# Patient Record
Sex: Male | Born: 1990 | Race: White | Hispanic: No | Marital: Single | Smoking: Never smoker
Health system: Southern US, Community
[De-identification: ages and names within clinical notes are randomized; demographics above are authoritative.]

## PROBLEM LIST (undated history)

## (undated) HISTORY — PX: TONSILLECTOMY: SUR1361

---
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
## 2008-02-05 ENCOUNTER — Emergency Department (HOSPITAL_COMMUNITY): Admission: EM | Admit: 2008-02-05 | Discharge: 2008-02-05 | Payer: Self-pay | Admitting: Family Medicine

## 2011-09-21 LAB — INFLUENZA A AND B ANTIGEN (CONVERTED LAB)
Inflenza A Ag: NEGATIVE
Influenza B Ag: NEGATIVE

## 2012-01-30 ENCOUNTER — Encounter (HOSPITAL_COMMUNITY): Payer: Self-pay | Admitting: *Deleted

## 2012-01-30 ENCOUNTER — Emergency Department (HOSPITAL_COMMUNITY)
Admission: EM | Admit: 2012-01-30 | Discharge: 2012-01-30 | Disposition: A | Discharge status: Home / Self Care | Payer: Self-pay | Attending: Emergency Medicine | Admitting: Emergency Medicine

## 2012-01-30 DIAGNOSIS — L259 Unspecified contact dermatitis, unspecified cause: Secondary | ICD-10-CM

## 2012-01-30 DIAGNOSIS — R21 Rash and other nonspecific skin eruption: Secondary | ICD-10-CM | POA: Insufficient documentation

## 2012-01-30 DIAGNOSIS — L0100 Impetigo, unspecified: Secondary | ICD-10-CM

## 2012-01-30 MED ORDER — MUPIROCIN CALCIUM 2 % EX CREA: TOPICAL_CREAM | Freq: Three times a day (TID) | CUTANEOUS | Status: AC

## 2012-01-30 MED ORDER — PREDNISONE 50 MG PO TABS: ORAL_TABLET | ORAL | Status: AC

## 2012-01-30 MED ORDER — CEPHALEXIN 500 MG PO CAPS: 500.0000 mg | ORAL_CAPSULE | Freq: Four times a day (QID) | ORAL | Status: AC

## 2012-01-30 NOTE — ED Notes (Signed)
Pt reports used new face wash the other day. Also went bowling recently and believes it could be from the oil.

## 2012-01-30 NOTE — ED Provider Notes (Signed)
History     CSN: [id number]  Arrival date & time 01/30/12  1150   First MD Initiated Contact with Patient 01/30/12 1212      Chief Complaint  Patient presents with  . Allergic Reaction    (Consider location/radiation/quality/duration/timing/severity/associated sxs/prior treatment) HPI Comments: Red itchy rash to right side of face for the past 3 days. Patient noticed that after he was exposed to bowling ball oil. He denies any fever, chest pain, shortness of breath or wheezing. She's been using Benadryl and hydrocortisone cream without relief. The redness is spreading over his right cheek. There is some crusting but no bleeding or drainage.  The history is provided by the patient.    History reviewed. No pertinent past medical history.  History reviewed. No pertinent past surgical history.  History reviewed. No pertinent family history.  History  Substance Use Topics  . Smoking status: Never Smoker   . Smokeless tobacco: Not on file  . Alcohol Use: Yes     occ      Review of Systems  Constitutional: Negative for fever.  Respiratory: Negative for cough, shortness of breath and wheezing.   Gastrointestinal: Negative for nausea and vomiting.  Genitourinary: Negative for flank pain.  Musculoskeletal: Negative for back pain.  Skin: Positive for rash.  Neurological: Negative for headaches.    Allergies  Penicillins  Home Medications   Current Outpatient Rx  Name Route Sig Dispense Refill  . DIPHENHYDRAMINE HCL 25 MG PO TABS Oral Take 25 mg by mouth every 6 (six) hours as needed. For allergies    . OMEGA-3 FATTY ACIDS 1000 MG PO CAPS Oral Take 1 g by mouth daily.    Marland Kitchen VITAMIN B-6 100 MG PO TABS Oral Take 100 mg by mouth daily.    Marland Kitchen VITAMIN C 500 MG PO TABS Oral Take 500 mg by mouth daily.      BP 129/80  Pulse 77  Temp(Src) 98 F (36.7 C) (Oral)  Resp 18  SpO2 98%  Physical Exam  Constitutional: He is oriented to person, place, and time. He appears  well-developed and well-nourished. No distress.  HENT:  Head: Normocephalic and atraumatic.  Mouth/Throat: Oropharynx is clear and moist. No oropharyngeal exudate.       Erythema to right cheek with crusting towards medial edge. Cranial nerves II through XII intact, no intraoral involvement  Eyes: Conjunctivae are normal. Pupils are equal, round, and reactive to light.  Neck: Normal range of motion.  Cardiovascular: Normal rate and regular rhythm.   No murmur heard. Pulmonary/Chest: Effort normal. No respiratory distress. He has no wheezes.  Abdominal: Soft. There is no tenderness. There is no rebound and no guarding.  Musculoskeletal: Normal range of motion. He exhibits no edema and no tenderness.  Neurological: He is alert and oriented to person, place, and time. No cranial nerve deficit.  Skin: Skin is warm.    ED Course  Procedures (including critical care time)  Labs Reviewed - No data to display No results found.   No diagnosis found.    MDM  Probable contact dermatitis with superinfection. No respiratory or mucous membrane involvement.  We'll treat with steroids and antihistamines and antibiotics follow up in urgent care 2 days        Glynn Octave, MD 01/30/12 1241

## 2012-01-30 NOTE — ED Notes (Signed)
Reports having red rash to left arm and right side of face x 3 days, thinks its an allergic reaction. No relief with benadryl. Airway intact, no resp distress noted at triage.

## 2012-11-13 ENCOUNTER — Other Ambulatory Visit: Payer: Self-pay | Admitting: Emergency Medicine

## 2015-04-08 ENCOUNTER — Encounter (HOSPITAL_COMMUNITY): Payer: Self-pay | Admitting: Emergency Medicine

## 2015-04-08 ENCOUNTER — Emergency Department (INDEPENDENT_AMBULATORY_CARE_PROVIDER_SITE_OTHER)
Admission: EM | Admit: 2015-04-08 | Discharge: 2015-04-08 | Disposition: A | Discharge status: Home / Self Care | Payer: Self-pay | Source: Home / Self Care | Attending: Family Medicine | Admitting: Family Medicine

## 2015-04-08 DIAGNOSIS — L247 Irritant contact dermatitis due to plants, except food: Secondary | ICD-10-CM

## 2015-04-08 MED ORDER — PREDNISONE 10 MG PO TABS: ORAL_TABLET | ORAL | Status: DC

## 2015-04-08 MED ORDER — TRIAMCINOLONE ACETONIDE 0.1 % EX CREA: 1.0000 "application " | TOPICAL_CREAM | Freq: Three times a day (TID) | CUTANEOUS | Status: DC

## 2015-04-08 NOTE — ED Provider Notes (Signed)
CSN: [id number]     Arrival date & time 04/08/15  1414 History   First MD Initiated Contact with Patient 04/08/15 1523     Chief Complaint  Patient presents with  . Poison Ivy   (Consider location/radiation/quality/duration/timing/severity/associated sxs/prior Treatment) HPI      25 year old male presents complaining of a poison ivy rash. He was working in the yard a few days ago and got a spot of poison ivy on his right leg. This is spread to both his arms and hands. Is extremely itchy. He has been taking Benadryl and applying calamine lotion with minimal relief. He denies any systemic symptoms.   History reviewed. No pertinent past medical history. History reviewed. No pertinent past surgical history. No family history on file. History  Substance Use Topics  . Smoking status: Never Smoker   . Smokeless tobacco: Not on file  . Alcohol Use: Yes     Comment: occ    Review of Systems  Skin: Positive for rash.  All other systems reviewed and are negative.   Allergies  Penicillins  Home Medications   Prior to Admission medications   Medication Sig Start Date End Date Taking? Authorizing Provider  diphenhydrAMINE (BENADRYL) 25 MG tablet Take 25 mg by mouth every 6 (six) hours as needed. For allergies    Historical Provider, MD  fish oil-omega-3 fatty acids 1000 MG capsule Take 1 g by mouth daily.    Historical Provider, MD  predniSONE (DELTASONE) 10 MG tablet 4 tabs PO QD for 4 days; 3 tabs PO QD for 3 days; 2 tabs PO QD for 2 days; 1 tab PO QD for 1 day 04/08/15   Graylon Good, PA-C  pyridOXINE (VITAMIN B-6) 100 MG tablet Take 100 mg by mouth daily.    Historical Provider, MD  triamcinolone cream (KENALOG) 0.1 % Apply 1 application topically 3 (three) times daily. 04/08/15   Graylon Good, PA-C  vitamin C (ASCORBIC ACID) 500 MG tablet Take 500 mg by mouth daily.    Historical Provider, MD   BP 127/77 mmHg  Pulse 74  Temp(Src) 97.8 F (36.6 C) (Oral)  Resp 18  SpO2  99% Physical Exam  Constitutional: He is oriented to person, place, and time. He appears well-developed and well-nourished. No distress.  HENT:  Head: Normocephalic.  Pulmonary/Chest: Effort normal. No respiratory distress.  Neurological: He is alert and oriented to person, place, and time. Coordination normal.  Skin: Skin is warm and dry. Rash (erythematous macular rash in a linear distribution lower extremity as well as both hands and arms) noted. He is not diaphoretic.  Psychiatric: He has a normal mood and affect. Judgment normal.  Nursing note and vitals reviewed.   ED Course  Procedures (including critical care time) Labs Review Labs Reviewed - No data to display  Imaging Review No results found.   MDM   1. Irritant contact dermatitis due to plant    Consistent with poison ivy. Treat with oral prednisone, oral Benadryl, and triamcinolone cream. Follow-up when necessary   Meds ordered this encounter  Medications  . predniSONE (DELTASONE) 10 MG tablet    Sig: 4 tabs PO QD for 4 days; 3 tabs PO QD for 3 days; 2 tabs PO QD for 2 days; 1 tab PO QD for 1 day    Dispense:  30 tablet    Refill:  0  . triamcinolone cream (KENALOG) 0.1 %    Sig: Apply 1 application topically 3 (three) times daily.  Dispense:  45 g    Refill:  0       Graylon GoodZachary H Mariachristina Holle, PA-C 04/08/15 1603

## 2015-04-08 NOTE — Discharge Instructions (Signed)

## 2015-04-08 NOTE — ED Notes (Signed)
Reports poss poison ivy onset 3-4 days on both arms Reports he has been doing some yard work Denies fevers, chills Alert, no signs of acute distress.

## 2016-04-26 ENCOUNTER — Encounter (HOSPITAL_COMMUNITY): Payer: Self-pay | Admitting: Emergency Medicine

## 2016-04-26 ENCOUNTER — Ambulatory Visit (HOSPITAL_COMMUNITY)
Admission: EM | Admit: 2016-04-26 | Discharge: 2016-04-26 | Disposition: A | Discharge status: Home / Self Care | Payer: Self-pay | Attending: Emergency Medicine | Admitting: Emergency Medicine

## 2016-04-26 DIAGNOSIS — Z880 Allergy status to penicillin: Secondary | ICD-10-CM | POA: Insufficient documentation

## 2016-04-26 DIAGNOSIS — Z0000 Encounter for general adult medical examination without abnormal findings: Secondary | ICD-10-CM

## 2016-04-26 DIAGNOSIS — Z113 Encounter for screening for infections with a predominantly sexual mode of transmission: Secondary | ICD-10-CM | POA: Insufficient documentation

## 2016-04-26 NOTE — Discharge Instructions (Signed)
Give us a working phone number so that we can contact you if needed. Refrain from sexual contact until you know your results and your partner(s) are treated. Return if you get worse, have a fever >100.4, or for any concerns.   Go to www.goodrx.com to look up your medications. This will give you a list of where you can find your prescriptions at the most affordable prices.

## 2016-04-26 NOTE — ED Notes (Signed)
Partner informed patient to get tested for std, did not give a diagnosis..  Patient denies any symptoms

## 2016-04-26 NOTE — ED Provider Notes (Addendum)
HPI  SUBJECTIVE:  Edward Daniels is a 26 y.o. male who presents for STD screening. States that he was told by his male partner last month that she had an STD, but wouldn't tell him which one it was. He has continued to have intercourse with her and also with another male. He uses condoms inconsistently. No aggravating, alleviating factors. Has not tried anything for this. No fevers, N/V, abd pain, back pain. No general rash, penile discharge, genital itching, testicular pain or swelling, unintentional weight loss, night sweats, urinary urgency, frequency, cloudy or odorous urine, hematuria, dysuria. Past medical history negative for gonorrhea, chlamydia, HSV, HIV, syphilis, Trichomonas, yeast infections, diabetes.    History reviewed. No pertinent past medical history.  History reviewed. No pertinent past surgical history.  No family history on file.  Social History  Substance Use Topics  . Smoking status: Never Smoker   . Smokeless tobacco: None  . Alcohol Use: Yes     Comment: occ    No current facility-administered medications for this encounter.  Current outpatient prescriptions:  .  diphenhydrAMINE (BENADRYL) 25 MG tablet, Take 25 mg by mouth every 6 (six) hours as needed. For allergies, Disp: , Rfl:  .  fish oil-omega-3 fatty acids 1000 MG capsule, Take 1 g by mouth daily., Disp: , Rfl:  .  pyridOXINE (VITAMIN B-6) 100 MG tablet, Take 100 mg by mouth daily., Disp: , Rfl:  .  vitamin C (ASCORBIC ACID) 500 MG tablet, Take 500 mg by mouth daily., Disp: , Rfl:   Allergies  Allergen Reactions  . Penicillins     Family history     ROS  As noted in HPI.   Physical Exam  BP 126/79 mmHg  Pulse 76  Temp(Src) 99.6 F (37.6 C)  Resp 16  SpO2 97%  Constitutional: Well developed, well nourished, no acute distress Eyes:  EOMI, conjunctiva normal bilaterally HENT: Normocephalic, atraumatic,mucus membranes moist Respiratory: Normal inspiratory effort Cardiovascular:  Normal rate GI: nondistended soft, nontender. No suprapubic tenderness  back: No CVA tenderness GU: normal circumcised male, testes descended bilaterally. No penile rash or discharge. No testicular tenderness, swelling. No epididymal tenderness. Patient declined chaperone Lymph: No inguinal lymphadenopathy skin: No rash, skin intact Musculoskeletal: no deformities Neurologic: Alert & oriented x 3, no focal neuro deficits Psychiatric: Speech and behavior appropriate   ED Course   Medications - No data to display  Orders Placed This Encounter  Procedures  . RPR    Standing Status: Standing     Number of Occurrences: 1     Standing Expiration Date:   . HIV antibody    Standing Status: Standing     Number of Occurrences: 1     Standing Expiration Date:     No results found for this or any previous visit (from the past 24 hour(s)). No results found.  ED Clinical Impression  Screening for STD (sexually transmitted disease)  Normal physical exam  ED Assessment/Plan   Previous labs reviewed. No history of gonorrhea or chlamydia.   Sent off GC/chlamydia, trichomonas, HIV, RPR. He wishes to wait for labs before being treated for an STI. States that he will return with his partner/partners for treatment if any of them come back positive. Will not treat empirically now.  Advised pt to refrain from sexual contact until  he knows lab results, and partner(s) are treated if necessary. Pt provided working phone number. Pt agrees with plan.   *This clinic note was created using Dragon dictation software.  Therefore, there may be occasional mistakes despite careful proofreading.  ?4/28. HIV, RPR negative.     Domenick Gong, MD 04/26/16 1830  Domenick Gong, MD 04/27/16 1228

## 2016-04-27 LAB — HIV ANTIBODY (ROUTINE TESTING W REFLEX): HIV Screen 4th Generation wRfx: NONREACTIVE

## 2016-04-27 LAB — RPR: RPR: NONREACTIVE

## 2016-04-27 LAB — URINE CYTOLOGY ANCILLARY ONLY
Chlamydia: NEGATIVE
Neisseria Gonorrhea: NEGATIVE
TRICH (WINDOWPATH): NEGATIVE

## 2016-05-31 ENCOUNTER — Ambulatory Visit (HOSPITAL_COMMUNITY)
Admission: EM | Admit: 2016-05-31 | Discharge: 2016-05-31 | Disposition: A | Discharge status: Home / Self Care | Payer: Self-pay | Attending: Emergency Medicine | Admitting: Emergency Medicine

## 2016-05-31 ENCOUNTER — Encounter (HOSPITAL_COMMUNITY): Payer: Self-pay | Admitting: *Deleted

## 2016-05-31 DIAGNOSIS — N50811 Right testicular pain: Secondary | ICD-10-CM

## 2016-05-31 LAB — POCT URINALYSIS DIP (DEVICE)
Bilirubin Urine: NEGATIVE
GLUCOSE, UA: NEGATIVE mg/dL
HGB URINE DIPSTICK: NEGATIVE
KETONES UR: NEGATIVE mg/dL
Leukocytes, UA: NEGATIVE
Nitrite: NEGATIVE
PH: 7.5 (ref 5.0–8.0)
PROTEIN: 30 mg/dL — AB
SPECIFIC GRAVITY, URINE: 1.015 (ref 1.005–1.030)
UROBILINOGEN UA: 0.2 mg/dL (ref 0.0–1.0)

## 2016-05-31 NOTE — ED Notes (Addendum)
Pt     Reports     Pain  r  Testicle         X  4-5  Days       denys      Any  Discharge       Seen      In    Apr     For  Std  Check         denys   Any  Injury     He   Ambulated  To  Room  With a  Slow  Steady  Gait     Appearing in no   Severe  Or  Acute  Distress

## 2016-05-31 NOTE — ED Provider Notes (Signed)
CSN: [id number]     Arrival date & time 05/31/16  1306 History   First MD Initiated Contact with Patient 05/31/16 1348     Chief Complaint  Patient presents with  . Testicle Pain   (Consider location/radiation/quality/duration/timing/severity/associated sxs/prior Treatment) HPI  He is a 26 year old man here for evaluation of right testicular pain. This is been going on for several days. It is not getting worse. Pain is intermittent. It seems to be worse in the evening and at nighttime. He denies any rashes or lesions. No dysuria or penile discharge. He does report some slight abdominal discomfort, but attributes this to anxiety about the issue. No fevers. He does report a new sexual partner, but they have used condoms. He had STD testing in April that was negative.  History reviewed. No pertinent past medical history. History reviewed. No pertinent past surgical history. History reviewed. No pertinent family history. Social History  Substance Use Topics  . Smoking status: Never Smoker   . Smokeless tobacco: None  . Alcohol Use: Yes     Comment: occ    Review of Systems As in history of present illness Allergies  Penicillins  Home Medications   Prior to Admission medications   Medication Sig Start Date End Date Taking? Authorizing Provider  diphenhydrAMINE (BENADRYL) 25 MG tablet Take 25 mg by mouth every 6 (six) hours as needed. For allergies    Historical Provider, MD  fish oil-omega-3 fatty acids 1000 MG capsule Take 1 g by mouth daily.    Historical Provider, MD  pyridOXINE (VITAMIN B-6) 100 MG tablet Take 100 mg by mouth daily.    Historical Provider, MD  vitamin C (ASCORBIC ACID) 500 MG tablet Take 500 mg by mouth daily.    Historical Provider, MD   Meds Ordered and Administered this Visit  Medications - No data to display  BP 124/78 mmHg  Pulse 78  Temp(Src) 98.6 F (37 C) (Oral)  Resp 18  SpO2 100% No data found.   Physical Exam  Constitutional: He is oriented  to person, place, and time. He appears well-developed and well-nourished.  Cardiovascular: Normal rate.   Pulmonary/Chest: Effort normal.  Abdominal: Hernia confirmed negative in the right inguinal area (he does have a slight bulge with hernia testing, but no true hernia.) and confirmed negative in the left inguinal area.  Genitourinary: Penis normal. Right testis shows no mass, no swelling and no tenderness. Left testis shows no mass, no swelling and no tenderness. Circumcised. No penile erythema or penile tenderness. No discharge found.  No tenderness of the epididymis.  Neurological: He is alert and oriented to person, place, and time.    ED Course  Procedures (including critical care time)  Labs Review Labs Reviewed  POCT URINALYSIS DIP (DEVICE) - Abnormal; Notable for the following:    Protein, ur 30 (*)    All other components within normal limits    Imaging Review No results found.   MDM   1. Right testicular pain    Nothing to suggest infection or STD on history or exam. I'm concerned about abdominal wall weakness without true hernia on the right side. We discussed symptomatic measures. If not improving in 1 week, follow-up with urology.    Charm RingsErin J Ayoub Arey, MD 05/31/16 [phone]

## 2016-05-31 NOTE — Discharge Instructions (Signed)
I do not see any sign of infection or STD. You do appear to have a slight weakness in the abdominal wall on the right. This may be causing your pain. Make sure you use good ergodynamics when lifting at work. Try wearing a pair of tighter underwear with a rolled up pair of socks in the right groin to see if this helps. Do gentle ab work. If it causes pain, stop doing this. If this is not getting better in 1 week, please follow-up with Dr. Mena GoesEskridge in urology.

## 2016-07-23 ENCOUNTER — Encounter (HOSPITAL_COMMUNITY): Payer: Self-pay | Admitting: Emergency Medicine

## 2016-07-23 ENCOUNTER — Ambulatory Visit (HOSPITAL_COMMUNITY)
Admission: EM | Admit: 2016-07-23 | Discharge: 2016-07-23 | Disposition: A | Discharge status: Home / Self Care | Payer: Self-pay | Attending: Emergency Medicine | Admitting: Emergency Medicine

## 2016-07-23 DIAGNOSIS — N489 Disorder of penis, unspecified: Secondary | ICD-10-CM

## 2016-07-23 DIAGNOSIS — N4889 Other specified disorders of penis: Secondary | ICD-10-CM

## 2016-07-23 DIAGNOSIS — R21 Rash and other nonspecific skin eruption: Secondary | ICD-10-CM | POA: Insufficient documentation

## 2016-07-23 MED ORDER — MUPIROCIN 2 % EX OINT: 1.0000 "application " | TOPICAL_OINTMENT | Freq: Two times a day (BID) | CUTANEOUS | 0 refills | Status: AC

## 2016-07-23 NOTE — ED Provider Notes (Signed)
[facility]    CSN: [id number] Arrival date & time: 07/23/16  1041  First Provider Contact:  First MD Initiated Contact with Patient 07/23/16 1121        History   Chief Complaint Chief Complaint  Patient presents with  . Rash    HPI Edward Daniels is a 26 y.o. male.   He is a 26 year old man here for evaluation of penile rash. He states this started 4 days ago. Initially, he states there were 2 lesions, but one has healed up. He has been using Neosporin on it. He states when it first showed up he had one episode of burning, but not with urination. He denies any dysuria or other urinary symptoms. No penile discharge. No new sexual partners. He was seen here in April and had STD testing including HIV and syphilis that was negative.    Rash  Associated symptoms: no abdominal pain and no fever     History reviewed. No pertinent past medical history.  There are no active problems to display for this patient.   Past Surgical History:  Procedure Laterality Date  . TONSILLECTOMY         Home Medications    Prior to Admission medications   Medication Sig Start Date End Date Taking? Authorizing Provider  diphenhydrAMINE (BENADRYL) 25 MG tablet Take 25 mg by mouth every 6 (six) hours as needed. For allergies    Historical Provider, MD  fish oil-omega-3 fatty acids 1000 MG capsule Take 1 g by mouth daily.    Historical Provider, MD  mupirocin ointment (BACTROBAN) 2 % Apply 1 application topically 2 (two) times daily. 07/23/16   Charm Rings, MD  pyridOXINE (VITAMIN B-6) 100 MG tablet Take 100 mg by mouth daily.    Historical Provider, MD  vitamin C (ASCORBIC ACID) 500 MG tablet Take 500 mg by mouth daily.    Historical Provider, MD    Family History Family History  Problem Relation Age of Onset  . Hypertension Mother     Social History Social History  Substance Use Topics  . Smoking status: Never Smoker  . Smokeless tobacco: Never Used  . Alcohol use Yes      Comment: occ     Allergies   Penicillins   Review of Systems Review of Systems  Constitutional: Negative for fever.  Gastrointestinal: Negative for abdominal pain.  Genitourinary: Positive for genital sores. Negative for dysuria, testicular pain and urgency.  Skin: Positive for rash.     Physical Exam Triage Vital Signs ED Triage Vitals [07/23/16 1059]  Enc Vitals Group     BP 128/81     Pulse Rate 60     Resp 16     Temp 98.6 F (37 C)     Temp Source Oral     SpO2 100 %     Weight      Height      Head Circumference      Peak Flow      Pain Score      Pain Loc      Pain Edu?      Excl. in GC?    No data found.   Updated Vital Signs BP 128/81 (BP Location: Right Arm)   Pulse 60   Temp 98.6 F (37 C) (Oral)   Resp 16   SpO2 100%   Visual Acuity Right Eye Distance:   Left Eye Distance:   Bilateral Distance:    Right Eye Near:  Left Eye Near:    Bilateral Near:     Physical Exam  Constitutional: He is oriented to person, place, and time. He appears well-developed and well-nourished. No distress.  Cardiovascular: Normal rate.   Pulmonary/Chest: Effort normal.  Genitourinary: Testes normal. Circumcised.  Genitourinary Comments: He has a 1.5 cm very superficial abrasion to the underside of the penile shaft. No erythema or drainage to suggest acute infection. No vesicular or papular lesions.  Neurological: He is alert and oriented to person, place, and time.     UC Treatments / Results  Labs (all labs ordered are listed, but only abnormal results are displayed) Labs Reviewed  HSV CULTURE AND TYPING    EKG  EKG Interpretation None       Radiology No results found.  Procedures Procedures (including critical care time)  Medications Ordered in UC Medications - No data to display   Initial Impression / Assessment and Plan / UC Course  I have reviewed the triage vital signs and the nursing notes.  Pertinent labs & imaging results  that were available during my care of the patient were reviewed by me and considered in my medical decision making (see chart for details).  Clinical Course    I suspect this is more of a traumatic abrasion than anything else. HSV swab collected, but low suspicion for herpes.  With negative STD testing 3 months ago and no new partners or risk factors, no need for repeat testing. Symptomatic treatment with regular washing and mupirocin ointment. Follow-up as needed.  Final Clinical Impressions(s) / UC Diagnoses   Final diagnoses:  Penile lesion    New Prescriptions New Prescriptions   MUPIROCIN OINTMENT (BACTROBAN) 2 %    Apply 1 application topically 2 (two) times daily.     Charm Rings, MD 07/23/16 [phone]

## 2016-07-23 NOTE — ED Triage Notes (Signed)
Patient reports a rash in genital area.  Reports one episode of burning.  Symptoms for 4-5 days

## 2016-07-23 NOTE — Discharge Instructions (Signed)
This looks like a friction ulcer. We collected a swab to test for herpes. We will call you with the results. Wash it twice a day with soap and water. Apply mupirocin ointment twice a day. Follow-up as needed.

## 2016-07-26 LAB — HSV CULTURE AND TYPING

## 2024-08-23 ENCOUNTER — Encounter (HOSPITAL_COMMUNITY): Payer: Self-pay

## 2024-08-23 ENCOUNTER — Ambulatory Visit (HOSPITAL_COMMUNITY)
Admission: EM | Admit: 2024-08-23 | Discharge: 2024-08-23 | Disposition: A | Discharge status: Home / Self Care | Attending: Internal Medicine | Admitting: Internal Medicine

## 2024-08-23 DIAGNOSIS — B353 Tinea pedis: Secondary | ICD-10-CM

## 2024-08-23 DIAGNOSIS — L03115 Cellulitis of right lower limb: Secondary | ICD-10-CM | POA: Diagnosis not present

## 2024-08-23 MED ORDER — DOXYCYCLINE HYCLATE 100 MG PO CAPS: 100.0000 mg | ORAL_CAPSULE | Freq: Two times a day (BID) | ORAL | 0 refills | Status: AC

## 2024-08-23 MED ORDER — CLOTRIMAZOLE-BETAMETHASONE 1-0.05 % EX CREA: TOPICAL_CREAM | CUTANEOUS | 2 refills | Status: AC

## 2024-08-23 NOTE — Discharge Instructions (Addendum)
 Right leg cellulitis, this is an infection in the soft tissue of the leg.  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  We will treat the cellulitis with antibiotics by mouth and the fungal foot infection with topical medication to help with the fungus as well as the itch.  We will treat with the following: Doxycycline  100 mg twice daily for 10 days. Take this with food.  Clotrimazole /Bethamethasone cream applied topically to the right medial and lateral ankle twice daily until the area has resolved If you develop significant worsening of the redness, pain, swelling or fevers that do not respond to fever reducing medication then recommend going to the emergency room immediately for further evaluation If you do not feel that the area is improving in the next 24 to 48 hours after starting antibiotics then recommend returning to urgent care Recommend elevating the leg to help with swelling May continue to alternate Tylenol and ibuprofen for fevers

## 2024-08-23 NOTE — ED Triage Notes (Signed)
 Patient states he has had leg pain and redness (calf area) since yesterday. Patient marked with a marker and the redness has increased. Patient denies any SOB. Or injury.  Patient sttes he took Excedrin and Advil for his symptoms.

## 2024-08-23 NOTE — ED Provider Notes (Signed)
 [facility]    CSN: [id number] Arrival date & time: 08/23/24  1629      History   Chief Complaint Chief Complaint  Patient presents with   Leg Pain    HPI Edward Daniels is a 34 y.o. male.   34 year old male who presents urgent care with complaints of pain, swelling and redness in the right leg.  He reports that this started yesterday but has gotten worse.  He denies any injury to the area.  He was running a fever yesterday but not today.  He has not had anything like this in the past.  He denies any varicose veins in the area.  On physical exam there was noted some dry skin around the ankles with a sock was removed and there was a large area of dry cracked and red skin.  On questioning the patient reports that he has had this for about 6 months.  The area is painful but is more itchy than anything.  He has been applying some over-the-counter athlete's foot medication but has not been consistent with it.   Leg Pain Associated symptoms: fever (Yesterday)   Associated symptoms: no back pain     History reviewed. No pertinent past medical history.  There are no active problems to display for this patient.   Past Surgical History:  Procedure Laterality Date   TONSILLECTOMY         Home Medications    Prior to Admission medications   Medication Sig Start Date End Date Taking? Authorizing Provider  clotrimazole -betamethasone  (LOTRISONE ) cream Apply to affected area 2 times daily 08/23/24  Yes Kameshia Madruga A, PA-C  doxycycline  (VIBRAMYCIN ) 100 MG capsule Take 1 capsule (100 mg total) by mouth 2 (two) times daily. 08/23/24  Yes Lucius Wise A, PA-C  diphenhydrAMINE (BENADRYL) 25 MG tablet Take 25 mg by mouth every 6 (six) hours as needed. For allergies    [provider]  fish oil-omega-3 fatty acids 1000 MG capsule Take 1 g by mouth daily. Patient not taking: Reported on 08/23/2024    [provider]  mupirocin  ointment (BACTROBAN ) 2 % Apply  1 application topically 2 (two) times daily. Patient not taking: Reported on 08/23/2024 07/23/16   Diedra Rocky PARAS, MD  pyridOXINE (VITAMIN B-6) 100 MG tablet Take 100 mg by mouth daily. Patient not taking: Reported on 08/23/2024    [provider]  vitamin C (ASCORBIC ACID) 500 MG tablet Take 500 mg by mouth daily.    [provider]    Family History Family History  Problem Relation Age of Onset   Hypertension Mother     Social History Social History   Tobacco Use   Smoking status: Never   Smokeless tobacco: Never  Vaping Use   Vaping status: Never Used  Substance Use Topics   Alcohol use: Yes    Comment: occ   Drug use: No     Allergies   Penicillins   Review of Systems Review of Systems  Constitutional:  Positive for fever (Yesterday). Negative for chills.  HENT:  Negative for ear pain and sore throat.   Eyes:  Negative for pain and visual disturbance.  Respiratory:  Negative for cough and shortness of breath.   Cardiovascular:  Negative for chest pain and palpitations.  Gastrointestinal:  Negative for abdominal pain and vomiting.  Genitourinary:  Negative for dysuria and hematuria.  Musculoskeletal:  Negative for arthralgias and back pain.  Skin:  Positive for color change and rash.  Neurological:  Negative for seizures and syncope.  All other systems reviewed and are negative.    Physical Exam Triage Vital Signs ED Triage Vitals [08/23/24 1720]  Encounter Vitals Group     BP 132/89     Girls Systolic BP Percentile      Girls Diastolic BP Percentile      Boys Systolic BP Percentile      Boys Diastolic BP Percentile      Pulse Rate 70     Resp 16     Temp 97.7 F (36.5 C)     Temp Source Oral     SpO2 98 %     Weight      Height      Head Circumference      Peak Flow      Pain Score 5     Pain Loc      Pain Education      Exclude from Growth Chart    No data found.  Updated Vital Signs BP 132/89 (BP Location: Right Arm)    Pulse 70   Temp 97.7 F (36.5 C) (Oral)   Resp 16   SpO2 98%   Visual Acuity Right Eye Distance:   Left Eye Distance:   Bilateral Distance:    Right Eye Near:   Left Eye Near:    Bilateral Near:     Physical Exam Vitals and nursing note reviewed.  Constitutional:      General: He is not in acute distress.    Appearance: He is well-developed.  HENT:     Head: Normocephalic and atraumatic.  Eyes:     Conjunctiva/sclera: Conjunctivae normal.  Cardiovascular:     Rate and Rhythm: Normal rate and regular rhythm.     Heart sounds: No murmur heard. Pulmonary:     Effort: Pulmonary effort is normal. No respiratory distress.     Breath sounds: Normal breath sounds.  Abdominal:     Palpations: Abdomen is soft.     Tenderness: There is no abdominal tenderness.  Musculoskeletal:        General: No swelling.     Cervical back: Neck supple.  Skin:    General: Skin is warm and dry.     Capillary Refill: Capillary refill takes less than 2 seconds.     Comments: See pictures.  Right posterior calf with significant erythema and induration consistent with cellulitis.  No fluctuance is felt.  The right medial and lateral ankle have dried cracked skin consistent with a fungal infection.  Less likely is venous stasis  Neurological:     Mental Status: He is alert.  Psychiatric:        Mood and Affect: Mood normal.         UC Treatments / Results  Labs (all labs ordered are listed, but only abnormal results are displayed) Labs Reviewed - No data to display  EKG   Radiology No results found.  Procedures Procedures (including critical care time)  Medications Ordered in UC Medications - No data to display  Initial Impression / Assessment and Plan / UC Course  I have reviewed the triage vital signs and the nursing notes.  Pertinent labs & imaging results that were available during my care of the patient were reviewed by me and considered in my medical decision making (see  chart for details).     Cellulitis of leg, right  Tinea pedis of right foot   Right leg cellulitis, this is an infection in  the soft tissue of the leg.  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  We will treat the cellulitis with antibiotics by mouth and the fungal foot infection with topical medication to help with the fungus as well as the itch.  We will treat with the following: Doxycycline  100 mg twice daily for 10 days. Take this with food.  Clotrimazole /Bethamethasone cream applied topically to the right medial and lateral ankle twice daily until the area has resolved If you develop significant worsening of the redness, pain, swelling or fevers that do not respond to fever reducing medication then recommend going to the emergency room immediately for further evaluation If you do not feel that the area is improving in the next 24 to 48 hours after starting antibiotics then recommend returning to urgent care Recommend elevating the leg to help with swelling May continue to alternate Tylenol and ibuprofen for fevers  Final Clinical Impressions(s) / UC Diagnoses   Final diagnoses:  Cellulitis of leg, right  Tinea pedis of right foot     Discharge Instructions      Right leg cellulitis, this is an infection in the soft tissue of the leg.  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  We will treat the cellulitis with antibiotics by mouth and the fungal foot infection with topical medication to help with the fungus as well as the itch.  We will treat with the following: Doxycycline  100 mg twice daily for 10 days. Take this with food.  Clotrimazole /Bethamethasone cream applied topically to the right medial and lateral ankle twice daily until the area has resolved If you develop significant worsening of the  redness, pain, swelling or fevers that do not respond to fever reducing medication then recommend going to the emergency room immediately for further evaluation If you do not feel that the area is improving in the next 24 to 48 hours after starting antibiotics then recommend returning to urgent care Recommend elevating the leg to help with swelling May continue to alternate Tylenol and ibuprofen for fevers    ED Prescriptions     Medication Sig Dispense Auth. Provider   doxycycline  (VIBRAMYCIN ) 100 MG capsule Take 1 capsule (100 mg total) by mouth 2 (two) times daily. 20 capsule Alita Waldren A, PA-C   clotrimazole -betamethasone  (LOTRISONE ) cream Apply to affected area 2 times daily 45 g Teresa Almarie LABOR, PA-C      PDMP not reviewed this encounter.   Teresa Almarie LABOR, [place] 08/23/24 [phone]
